# Patient Record
Sex: Male | Born: 1960 | Race: White | Hispanic: No | Marital: Single | Smoking: Current every day smoker
Health system: Southern US, Community
[De-identification: ages and names within clinical notes are randomized; demographics above are authoritative.]

---

## 2006-10-31 ENCOUNTER — Ambulatory Visit: Payer: Self-pay | Admitting: Orthopaedic Surgery

## 2007-02-01 ENCOUNTER — Encounter: Payer: Self-pay | Admitting: Family Medicine

## 2007-02-25 ENCOUNTER — Encounter: Payer: Self-pay | Admitting: Family Medicine

## 2018-04-11 ENCOUNTER — Other Ambulatory Visit: Payer: Self-pay

## 2018-04-11 ENCOUNTER — Emergency Department: Payer: Worker's Compensation

## 2018-04-11 ENCOUNTER — Emergency Department
Admission: EM | Admit: 2018-04-11 | Discharge: 2018-04-11 | Disposition: A | Discharge status: Home / Self Care | Payer: Worker's Compensation | Attending: Emergency Medicine | Admitting: Emergency Medicine

## 2018-04-11 DIAGNOSIS — Y9289 Other specified places as the place of occurrence of the external cause: Secondary | ICD-10-CM | POA: Insufficient documentation

## 2018-04-11 DIAGNOSIS — S61111A Laceration without foreign body of right thumb with damage to nail, initial encounter: Secondary | ICD-10-CM | POA: Diagnosis present

## 2018-04-11 DIAGNOSIS — W230XXA Caught, crushed, jammed, or pinched between moving objects, initial encounter: Secondary | ICD-10-CM | POA: Insufficient documentation

## 2018-04-11 DIAGNOSIS — Y998 Other external cause status: Secondary | ICD-10-CM | POA: Diagnosis not present

## 2018-04-11 DIAGNOSIS — Y9389 Activity, other specified: Secondary | ICD-10-CM | POA: Insufficient documentation

## 2018-04-11 DIAGNOSIS — Z23 Encounter for immunization: Secondary | ICD-10-CM | POA: Insufficient documentation

## 2018-04-11 DIAGNOSIS — T1490XA Injury, unspecified, initial encounter: Secondary | ICD-10-CM

## 2018-04-11 MED ORDER — CEPHALEXIN 500 MG PO CAPS
500.0000 mg | ORAL_CAPSULE | Freq: Once | ORAL | Status: AC
  Administered 2018-04-11: 500 mg via ORAL
  Filled 2018-04-11: qty 1

## 2018-04-11 MED ORDER — LIDOCAINE-EPINEPHRINE-TETRACAINE (LET) SOLUTION
NASAL | Status: AC
  Administered 2018-04-11: 3 mL via TOPICAL
  Filled 2018-04-11: qty 3

## 2018-04-11 MED ORDER — LIDOCAINE HCL (PF) 1 % IJ SOLN
INTRAMUSCULAR | Status: AC
  Filled 2018-04-11: qty 5

## 2018-04-11 MED ORDER — CEPHALEXIN 500 MG PO CAPS: 500.0000 mg | ORAL_CAPSULE | Freq: Two times a day (BID) | ORAL | 0 refills | Status: AC

## 2018-04-11 MED ORDER — LIDOCAINE HCL (PF) 1 % IJ SOLN
5.0000 mL | Freq: Once | INTRAMUSCULAR | Status: AC
  Administered 2018-04-11: 5 mL via INTRADERMAL

## 2018-04-11 MED ORDER — TRAMADOL HCL 50 MG PO TABS
50.0000 mg | ORAL_TABLET | Freq: Once | ORAL | Status: AC
  Administered 2018-04-11: 50 mg via ORAL
  Filled 2018-04-11: qty 1

## 2018-04-11 MED ORDER — TETANUS-DIPHTH-ACELL PERTUSSIS 5-2.5-18.5 LF-MCG/0.5 IM SUSP
0.5000 mL | Freq: Once | INTRAMUSCULAR | Status: AC
  Administered 2018-04-11: 0.5 mL via INTRAMUSCULAR
  Filled 2018-04-11: qty 0.5

## 2018-04-11 MED ORDER — LIDOCAINE-EPINEPHRINE-TETRACAINE (LET) SOLUTION
3.0000 mL | Freq: Once | NASAL | Status: AC
  Administered 2018-04-11: 3 mL via TOPICAL

## 2018-04-11 NOTE — ED Notes (Signed)
Dressing applied to the right thumb

## 2018-04-11 NOTE — ED Notes (Signed)
[facility] UDS completed.  Pts wc form states that the patient is to arrive with COC paperwork.  Pt states they did not provide him with any paperwork.  [employer] paperwork was used.

## 2018-04-11 NOTE — ED Triage Notes (Signed)
Pt arrives to ED via POV from work with c/o laceration to the thumb on the left hand that was cut while operating a machine. Pt is filing WC, profile indicates UDS and that a COC forn should be provided by employer, but pt states he was told to come here and not given any paperwork. Pt arrives with bandage in place, bleeding controlled at this time. Pt unsure of last Tetanus shot, but reports less than 10 yrs ago.

## 2018-04-11 NOTE — ED Provider Notes (Signed)
[facility] Emergency Department Provider Note ______   First MD Initiated Contact with Patient 04/11/18 [phone]     (approximate)  I have reviewed the triage vital signs and the nursing notes.   HISTORY  Chief Complaint Laceration   HPI Blake LinsRaymond Smolenski Jr. is a 57 y.o. male presents to the emergency department following accidental laceration to the right thumb which occurred while at work tonight.  She states that he was operating machinery when his finger got caught resultant laceration to the distal portion of the finger.  Patient unsure when last tetanus shot was stating it is a long time.  Past medical history None There are no active problems to display for this patient.   Surgical history None  Prior to Admission medications   Medication Sig Start Date End Date Taking? Authorizing Provider  cephALEXin (KEFLEX) 500 MG capsule Take 1 capsule (500 mg total) by mouth 2 (two) times daily for 10 days. 04/11/18 04/21/18  Darci CurrentBrown, [place], MD    Allergies No known drug allergies No family history on file.  Social History Social History   Tobacco Use  . Smoking status: Current Every Day Smoker  . Smokeless tobacco: Never Used  Substance Use Topics  . Alcohol use: Not on file  . Drug use: Not on file    Review of Systems Constitutional: No fever/chills Eyes: No visual changes. ENT: No sore throat. Cardiovascular: Denies chest pain. Respiratory: Denies shortness of breath. Gastrointestinal: No abdominal pain.  No nausea, no vomiting.  No diarrhea.  No constipation. Genitourinary: Negative for dysuria. Musculoskeletal: Negative for neck pain.  Negative for back pain. Integumentary: Negative for rash. Neurological: Negative for headaches, focal weakness or numbness.   ____________________________________________   PHYSICAL EXAM:  VITAL SIGNS: ED Triage Vitals  Enc Vitals Group     BP 04/11/18 0226 (!) 181/100     Pulse Rate  04/11/18 0226 (!) 117     Resp 04/11/18 0226 18     Temp 04/11/18 0226 98.5 F (36.9 C)     Temp Source 04/11/18 0226 Oral     SpO2 04/11/18 0226 99 %     Weight 04/11/18 0225 93 kg (205 lb)     Height 04/11/18 0225 1.753 m (5\' 9" )     Head Circumference --      Peak Flow --      Pain Score 04/11/18 0225 6     Pain Loc --      Pain Edu? --      Excl. in GC? --     Constitutional: Alert and oriented. Well appearing and in no acute distress. Eyes: Conjunctivae are normal.  Mouth/Throat: Mucous membranes are moist. Neck: No stridor.  Cardiovascular: Normal rate, regular rhythm. Good peripheral circulation. Grossly normal heart sounds. Respiratory: Normal respiratory effort.  No retractions. Lungs CTAB. Musculoskeletal: Right thumb 2 cm laceration distal portion of the involving the nailbed Neurologic:  Normal speech and language. No gross focal neurologic deficits are appreciated.  Skin:  Skin is warm, dry and intact. No rash noted. Psychiatric: Anxious affect. Speech and behavior are normal.    Procedures   ____________________________________________   INITIAL IMPRESSION / ASSESSMENT AND PLAN / ED COURSE  As part of my medical decision making, I reviewed the following data within the electronic MEDICAL RECORD NUMBER  57 year old male presenting with above-stated history and physical exam secondary to right thumb laceration.  I recommended suture repair to the patient however he requested alternative therapy  and as such Dermabond was applied and dressing with antibiotic ointment also applied.  Concern for possible infection and as such Keflex was given for prophylaxis.    ____________________________________________  FINAL CLINICAL IMPRESSION(S) / ED DIAGNOSES  Final diagnoses:  Laceration of right thumb without foreign body with damage to nail, initial encounter     MEDICATIONS GIVEN DURING THIS VISIT:  Medications  traMADol (ULTRAM) tablet 50 mg (50 mg Oral Given  04/11/18 0416)  lidocaine-EPINEPHrine-tetracaine (LET) solution (3 mLs Topical Given 04/11/18 0417)  lidocaine (PF) (XYLOCAINE) 1 % injection 5 mL (5 mLs Intradermal Given 04/11/18 0417)  cephALEXin (KEFLEX) capsule 500 mg (500 mg Oral Given 04/11/18 0504)  Tdap (BOOSTRIX) injection 0.5 mL (0.5 mLs Intramuscular Given 04/11/18 0504)     ED Discharge Orders         Ordered    cephALEXin (KEFLEX) 500 MG capsule  2 times daily     04/11/18 1610           Note:  This document was prepared using Dragon voice recognition software and may include unintentional dictation errors.    Darci Current, MD 04/11/18 [phone]

## 2020-05-02 IMAGING — DX DG FINGER THUMB 2+V*R*
3 series · 3 of 3 positions shown · non-contrast
Comparison: None.

CLINICAL DATA: Injury. Cut by a machine at work, laceration
distally.

EXAM:
RIGHT THUMB 2+V

[finger ap]
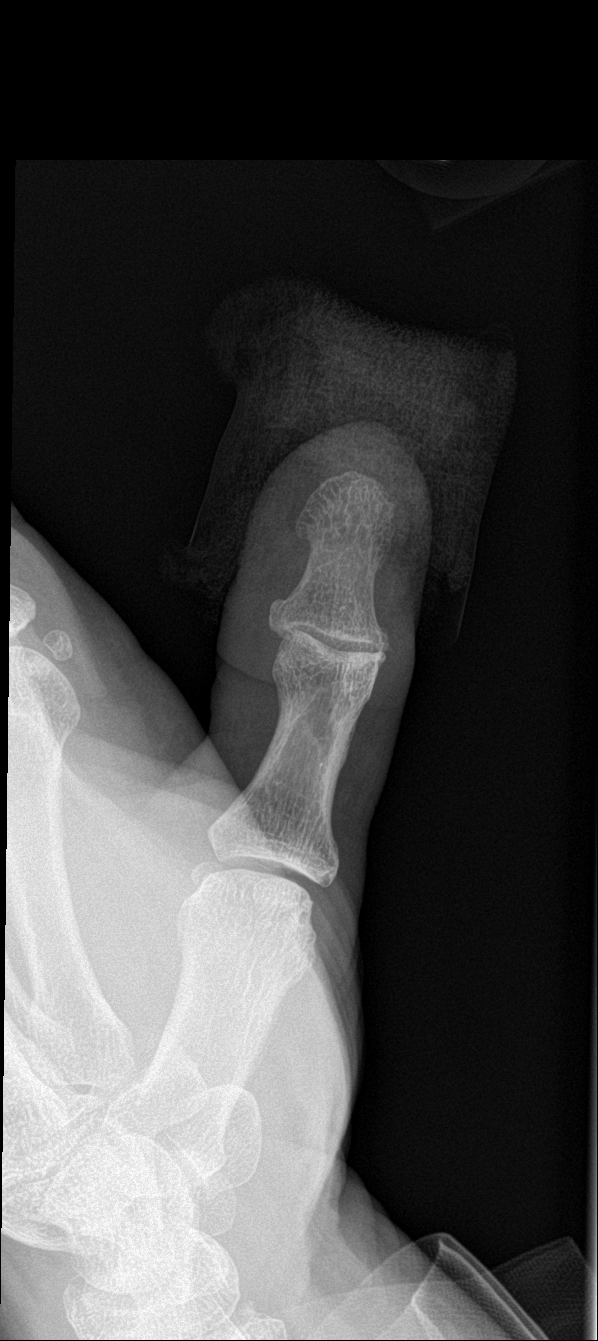

[finger obl]
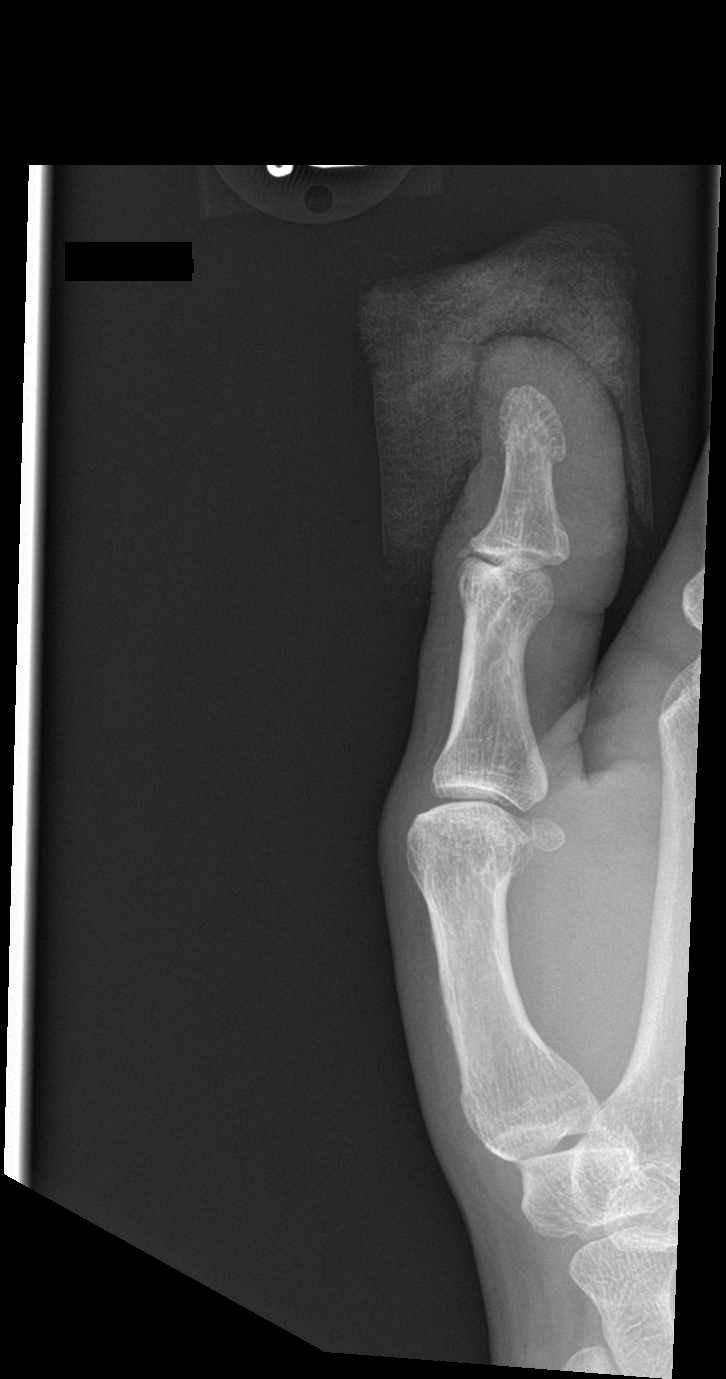

[finger lat]
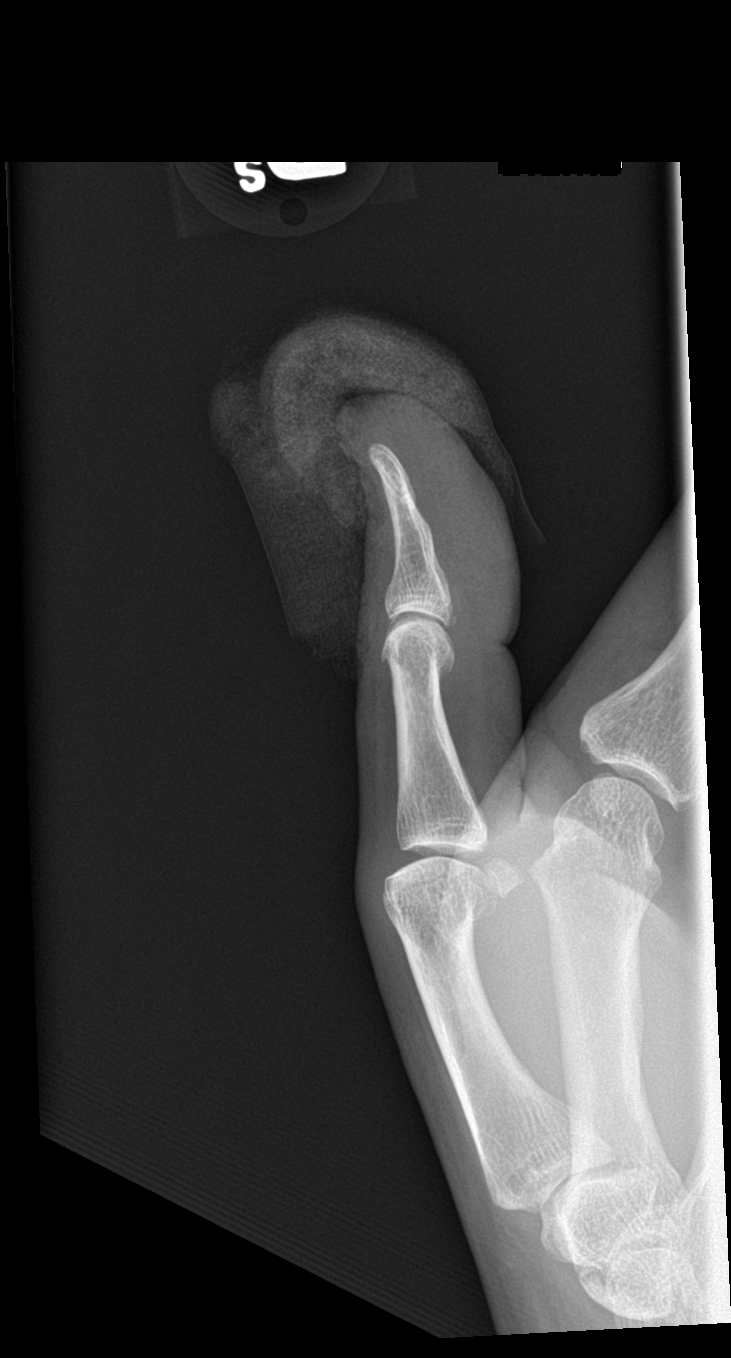

[3 of 3 positions shown; findings below may reference images not displayed]

FINDINGS: There is no evidence of fracture or dislocation. There is no
evidence of arthropathy or other focal bone abnormality. A dressing
overlies the distal digit, no radiopaque foreign body.
IMPRESSION: No radiopaque foreign body or acute osseous abnormality. A dressing
overlies the distal digit.

## 2022-01-01 ENCOUNTER — Ambulatory Visit: Payer: Self-pay | Admitting: Family Medicine

## 2022-01-04 ENCOUNTER — Ambulatory Visit: Payer: Self-pay | Admitting: Family Medicine
